# Patient Record
Sex: Female | Born: 1970 | Race: White | Hispanic: No | Marital: Single | Smoking: Former smoker
Health system: Southern US, Community
[De-identification: ages and names within clinical notes are randomized; demographics above are authoritative.]

---

## 2017-10-30 DIAGNOSIS — L821 Other seborrheic keratosis: Secondary | ICD-10-CM | POA: Diagnosis not present

## 2017-10-30 DIAGNOSIS — D225 Melanocytic nevi of trunk: Secondary | ICD-10-CM | POA: Diagnosis not present

## 2017-10-30 DIAGNOSIS — D2261 Melanocytic nevi of right upper limb, including shoulder: Secondary | ICD-10-CM | POA: Diagnosis not present

## 2017-10-30 DIAGNOSIS — D2262 Melanocytic nevi of left upper limb, including shoulder: Secondary | ICD-10-CM | POA: Diagnosis not present

## 2017-10-30 DIAGNOSIS — D485 Neoplasm of uncertain behavior of skin: Secondary | ICD-10-CM | POA: Diagnosis not present

## 2017-11-05 DIAGNOSIS — Z1231 Encounter for screening mammogram for malignant neoplasm of breast: Secondary | ICD-10-CM | POA: Diagnosis not present

## 2017-11-05 DIAGNOSIS — N6002 Solitary cyst of left breast: Secondary | ICD-10-CM | POA: Diagnosis not present

## 2017-11-07 DIAGNOSIS — Z1231 Encounter for screening mammogram for malignant neoplasm of breast: Secondary | ICD-10-CM | POA: Diagnosis not present

## 2017-11-07 DIAGNOSIS — N6002 Solitary cyst of left breast: Secondary | ICD-10-CM | POA: Diagnosis not present

## 2017-11-13 DIAGNOSIS — N63 Unspecified lump in unspecified breast: Secondary | ICD-10-CM | POA: Diagnosis not present

## 2017-11-14 DIAGNOSIS — N6002 Solitary cyst of left breast: Secondary | ICD-10-CM | POA: Diagnosis not present

## 2017-11-14 DIAGNOSIS — N6012 Diffuse cystic mastopathy of left breast: Secondary | ICD-10-CM | POA: Diagnosis not present

## 2017-11-14 DIAGNOSIS — R921 Mammographic calcification found on diagnostic imaging of breast: Secondary | ICD-10-CM | POA: Diagnosis not present

## 2017-11-14 DIAGNOSIS — Z1231 Encounter for screening mammogram for malignant neoplasm of breast: Secondary | ICD-10-CM | POA: Diagnosis not present

## 2019-02-03 DIAGNOSIS — R921 Mammographic calcification found on diagnostic imaging of breast: Secondary | ICD-10-CM | POA: Diagnosis not present

## 2019-04-02 DIAGNOSIS — D2262 Melanocytic nevi of left upper limb, including shoulder: Secondary | ICD-10-CM | POA: Diagnosis not present

## 2019-04-02 DIAGNOSIS — D225 Melanocytic nevi of trunk: Secondary | ICD-10-CM | POA: Diagnosis not present

## 2019-04-02 DIAGNOSIS — D485 Neoplasm of uncertain behavior of skin: Secondary | ICD-10-CM | POA: Diagnosis not present

## 2019-04-02 DIAGNOSIS — D2271 Melanocytic nevi of right lower limb, including hip: Secondary | ICD-10-CM | POA: Diagnosis not present

## 2019-04-02 DIAGNOSIS — D2261 Melanocytic nevi of right upper limb, including shoulder: Secondary | ICD-10-CM | POA: Diagnosis not present

## 2019-07-29 DIAGNOSIS — Z0000 Encounter for general adult medical examination without abnormal findings: Secondary | ICD-10-CM | POA: Diagnosis not present

## 2019-08-12 DIAGNOSIS — Z1159 Encounter for screening for other viral diseases: Secondary | ICD-10-CM | POA: Diagnosis not present

## 2019-08-12 DIAGNOSIS — Z13220 Encounter for screening for lipoid disorders: Secondary | ICD-10-CM | POA: Diagnosis not present

## 2019-08-12 DIAGNOSIS — Z23 Encounter for immunization: Secondary | ICD-10-CM | POA: Diagnosis not present

## 2019-08-12 DIAGNOSIS — R7301 Impaired fasting glucose: Secondary | ICD-10-CM | POA: Diagnosis not present

## 2019-08-12 DIAGNOSIS — Z0000 Encounter for general adult medical examination without abnormal findings: Secondary | ICD-10-CM | POA: Diagnosis not present

## 2019-09-15 DIAGNOSIS — E7800 Pure hypercholesterolemia, unspecified: Secondary | ICD-10-CM | POA: Diagnosis not present

## 2019-09-15 DIAGNOSIS — I10 Essential (primary) hypertension: Secondary | ICD-10-CM | POA: Diagnosis not present

## 2019-09-15 DIAGNOSIS — E119 Type 2 diabetes mellitus without complications: Secondary | ICD-10-CM | POA: Diagnosis not present

## 2019-11-26 DIAGNOSIS — E119 Type 2 diabetes mellitus without complications: Secondary | ICD-10-CM | POA: Diagnosis not present

## 2020-02-02 DIAGNOSIS — Z1231 Encounter for screening mammogram for malignant neoplasm of breast: Secondary | ICD-10-CM | POA: Diagnosis not present

## 2020-03-15 DIAGNOSIS — E119 Type 2 diabetes mellitus without complications: Secondary | ICD-10-CM | POA: Diagnosis not present

## 2020-03-15 DIAGNOSIS — I10 Essential (primary) hypertension: Secondary | ICD-10-CM | POA: Diagnosis not present

## 2020-03-15 DIAGNOSIS — E7800 Pure hypercholesterolemia, unspecified: Secondary | ICD-10-CM | POA: Diagnosis not present

## 2020-03-15 DIAGNOSIS — E669 Obesity, unspecified: Secondary | ICD-10-CM | POA: Diagnosis not present

## 2020-08-09 DIAGNOSIS — I10 Essential (primary) hypertension: Secondary | ICD-10-CM | POA: Diagnosis not present

## 2020-08-09 DIAGNOSIS — E7800 Pure hypercholesterolemia, unspecified: Secondary | ICD-10-CM | POA: Diagnosis not present

## 2020-08-09 DIAGNOSIS — E119 Type 2 diabetes mellitus without complications: Secondary | ICD-10-CM | POA: Diagnosis not present

## 2020-08-09 DIAGNOSIS — Z0000 Encounter for general adult medical examination without abnormal findings: Secondary | ICD-10-CM | POA: Diagnosis not present

## 2020-08-19 DIAGNOSIS — E119 Type 2 diabetes mellitus without complications: Secondary | ICD-10-CM | POA: Diagnosis not present

## 2020-08-19 DIAGNOSIS — Z20822 Contact with and (suspected) exposure to covid-19: Secondary | ICD-10-CM | POA: Diagnosis not present

## 2020-08-19 DIAGNOSIS — I10 Essential (primary) hypertension: Secondary | ICD-10-CM | POA: Diagnosis not present

## 2020-11-15 DIAGNOSIS — E119 Type 2 diabetes mellitus without complications: Secondary | ICD-10-CM | POA: Diagnosis not present

## 2020-11-15 DIAGNOSIS — H35033 Hypertensive retinopathy, bilateral: Secondary | ICD-10-CM | POA: Diagnosis not present

## 2020-11-15 DIAGNOSIS — H2513 Age-related nuclear cataract, bilateral: Secondary | ICD-10-CM | POA: Diagnosis not present

## 2020-11-15 DIAGNOSIS — H16143 Punctate keratitis, bilateral: Secondary | ICD-10-CM | POA: Diagnosis not present

## 2021-02-07 DIAGNOSIS — Z1231 Encounter for screening mammogram for malignant neoplasm of breast: Secondary | ICD-10-CM | POA: Diagnosis not present

## 2021-02-09 DIAGNOSIS — I10 Essential (primary) hypertension: Secondary | ICD-10-CM | POA: Diagnosis not present

## 2021-02-09 DIAGNOSIS — E119 Type 2 diabetes mellitus without complications: Secondary | ICD-10-CM | POA: Diagnosis not present

## 2021-02-09 DIAGNOSIS — E7800 Pure hypercholesterolemia, unspecified: Secondary | ICD-10-CM | POA: Diagnosis not present

## 2021-02-14 DIAGNOSIS — I10 Essential (primary) hypertension: Secondary | ICD-10-CM | POA: Diagnosis not present

## 2021-02-14 DIAGNOSIS — E119 Type 2 diabetes mellitus without complications: Secondary | ICD-10-CM | POA: Diagnosis not present

## 2021-04-26 DIAGNOSIS — D2262 Melanocytic nevi of left upper limb, including shoulder: Secondary | ICD-10-CM | POA: Diagnosis not present

## 2021-04-26 DIAGNOSIS — D225 Melanocytic nevi of trunk: Secondary | ICD-10-CM | POA: Diagnosis not present

## 2021-04-26 DIAGNOSIS — L918 Other hypertrophic disorders of the skin: Secondary | ICD-10-CM | POA: Diagnosis not present

## 2021-04-26 DIAGNOSIS — D2261 Melanocytic nevi of right upper limb, including shoulder: Secondary | ICD-10-CM | POA: Diagnosis not present

## 2021-08-15 DIAGNOSIS — E119 Type 2 diabetes mellitus without complications: Secondary | ICD-10-CM | POA: Diagnosis not present

## 2021-08-15 DIAGNOSIS — I10 Essential (primary) hypertension: Secondary | ICD-10-CM | POA: Diagnosis not present

## 2021-08-15 DIAGNOSIS — E7800 Pure hypercholesterolemia, unspecified: Secondary | ICD-10-CM | POA: Diagnosis not present

## 2021-09-06 DIAGNOSIS — Z23 Encounter for immunization: Secondary | ICD-10-CM | POA: Diagnosis not present

## 2021-09-06 DIAGNOSIS — E119 Type 2 diabetes mellitus without complications: Secondary | ICD-10-CM | POA: Diagnosis not present

## 2021-09-06 DIAGNOSIS — Z0000 Encounter for general adult medical examination without abnormal findings: Secondary | ICD-10-CM | POA: Diagnosis not present

## 2021-10-13 ENCOUNTER — Ambulatory Visit (INDEPENDENT_AMBULATORY_CARE_PROVIDER_SITE_OTHER): Payer: BC Managed Care – PPO | Admitting: Cardiovascular Disease

## 2021-10-13 ENCOUNTER — Other Ambulatory Visit: Payer: Self-pay

## 2021-10-13 ENCOUNTER — Encounter: Payer: Self-pay | Admitting: Cardiovascular Disease

## 2021-10-13 DIAGNOSIS — E785 Hyperlipidemia, unspecified: Secondary | ICD-10-CM | POA: Insufficient documentation

## 2021-10-13 DIAGNOSIS — I10 Essential (primary) hypertension: Secondary | ICD-10-CM | POA: Diagnosis not present

## 2021-10-13 DIAGNOSIS — Z720 Tobacco use: Secondary | ICD-10-CM

## 2021-10-13 DIAGNOSIS — E782 Mixed hyperlipidemia: Secondary | ICD-10-CM

## 2021-10-13 DIAGNOSIS — Z8249 Family history of ischemic heart disease and other diseases of the circulatory system: Secondary | ICD-10-CM | POA: Insufficient documentation

## 2021-10-13 DIAGNOSIS — I739 Peripheral vascular disease, unspecified: Secondary | ICD-10-CM | POA: Insufficient documentation

## 2021-10-13 NOTE — Assessment & Plan Note (Signed)
30 to 40 pack years of tobacco abuse having quit in 2016.  She does not have noticeable clinical sequelae.

## 2021-10-13 NOTE — Assessment & Plan Note (Signed)
History of essential hypertension blood pressure measured today 176/86.  Her pressure at 1 she was in her PCPs office recently was normal.  She is on losartan.

## 2021-10-13 NOTE — Assessment & Plan Note (Signed)
Diana Gates complains of bilateral calf claudication left greater than right for the last year.  This is not lifestyle limiting but given her family history she wishes to have this further evaluated.  We will get lower extremity arterial Doppler studies.

## 2021-10-13 NOTE — Assessment & Plan Note (Signed)
Both parents have had open heart surgery.  Her mother, [name] , is a patient of mine as well, and has a history of CAD and PAD.

## 2021-10-13 NOTE — Patient Instructions (Signed)
Medication Instructions:  Your physician recommends that you continue on your current medications as directed. Please refer to the Current Medication list given to you today.  *If you need a refill on your cardiac medications before your next appointment, please call your pharmacy*   Testing/Procedures: Your physician has requested that you have a lower extremity arterial duplex. This test is an ultrasound of the arteries in the legs. It looks at arterial blood flow in the legs. Allow one hour for Lower Arterial scans. There are no restrictions or special instructions   Your physician has requested that you have an ankle brachial index (ABI). During this test an ultrasound and blood pressure cuff are used to evaluate the arteries that supply the arms and legs with blood. Allow thirty minutes for this exam. There are no restrictions or special instructions. These procedures are done at [address] [facility]   Dr. Allyson Sabal has ordered a CT coronary calcium score.   Test location:  [facility] ([address] [place], [place] [address])   This is $99 out of pocket.   Coronary CalciumScan A coronary calcium scan is an imaging test used to look for deposits of calcium and other fatty materials (plaques) in the inner lining of the blood vessels of the heart (coronary arteries). These deposits of calcium and plaques can partly clog and narrow the coronary arteries without producing any symptoms or warning signs. This puts a person at risk for a heart attack. This test can detect these deposits before symptoms develop. Tell a health care provider about: Any allergies you have. All medicines you are taking, including vitamins, herbs, eye drops, creams, and over-the-counter medicines. Any problems you or family members have had with anesthetic medicines. Any blood disorders you have. Any surgeries you have had. Any medical conditions you have. Whether you are pregnant or may be  pregnant. What are the risks? Generally, this is a safe procedure. However, problems may occur, including: Harm to a pregnant woman and her unborn baby. This test involves the use of radiation. Radiation exposure can be dangerous to a pregnant woman and her unborn baby. If you are pregnant, you generally should not have this procedure done. Slight increase in the risk of cancer. This is because of the radiation involved in the test. What happens before the procedure? No preparation is needed for this procedure. What happens during the procedure? You will undress and remove any jewelry around your neck or chest. You will put on a hospital gown. Sticky electrodes will be placed on your chest. The electrodes will be connected to an electrocardiogram (ECG) machine to record a tracing of the electrical activity of your heart. A CT scanner will take pictures of your heart. During this time, you will be asked to lie still and hold your breath for 2-3 seconds while a picture of your heart is being taken. The procedure may vary among health care providers and hospitals. What happens after the procedure? You can get dressed. You can return to your normal activities. It is up to you to get the results of your test. Ask your health care provider, or the department that is doing the test, when your results will be ready. Summary A coronary calcium scan is an imaging test used to look for deposits of calcium and other fatty materials (plaques) in the inner lining of the blood vessels of the heart (coronary arteries). Generally, this is a safe procedure. Tell your health care provider if you  are pregnant or may be pregnant. No preparation is needed for this procedure. A CT scanner will take pictures of your heart. You can return to your normal activities after the scan is done. This information is not intended to replace advice given to you by your health care provider. Make sure you discuss any questions  you have with your health care provider. Document Released: 02/09/2008 Document Revised: 07/02/2016 Document Reviewed: 07/02/2016 Elsevier Interactive Patient Education  2017 [employer].    Follow-Up: At [facility], you and your health needs are our priority.  As part of our continuing mission to provide you with exceptional heart care, we have created designated Provider Care Teams.  These Care Teams include your primary Cardiologist (physician) and Advanced Practice Providers (APPs -  Physician Assistants and Nurse Practitioners) who all work together to provide you with the care you need, when you need it.  We recommend signing up for the patient portal called "MyChart".  Sign up information is provided on this After Visit Summary.  MyChart is used to connect with patients for Virtual Visits (Telemedicine).  Patients are able to view lab/test results, encounter notes, upcoming appointments, etc.  Non-urgent messages can be sent to your provider as well.   To learn more about what you can do with MyChart, go to [email or web address].    Your next appointment:   12 month(s)  The format for your next appointment:   In Person  Provider:   Nanetta Batty, MD

## 2021-10-13 NOTE — Progress Notes (Signed)
10/13/2021 Diana Gates   11/13/1970  [id number]  Primary Physician Pcp, No Primary Cardiologist: Diana Harp MD Renae Gloss  HPI:  Diana Gates is a 51 y.o. fit appearing divorced Caucasian female with no children referred by Dr. Lindell Gates, her primary care provider, for evaluation of PAD. Her mother, Diana Gates, is also a patient of mine.  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  Her risk factors include 40-pack-year tobacco abuse having quit in 2016, treated hypertension, diabetes, hyperlipidemia as well as family history with both mother and father who had CAD.  She is never had a heart attack or stroke.  She denies chest pain or shortness of breath.  She does mention claudication left greater than right for the last year which is not lifestyle limiting.   Current Meds  Medication Sig   BIOTIN PO Take 5,000 mg by mouth.   cetirizine (ZYRTEC) 10 MG tablet Take 10 mg by mouth daily.   CINNAMON PO Take 1,000 mg by mouth.   losartan (COZAAR) 25 MG tablet Take 25 mg by mouth daily.   metFORMIN (GLUCOPHAGE-XR) 500 MG 24 hr tablet SMARTSIG:2 Tablet(s) By Mouth Every Evening   Probiotic Product (PROBIOTIC PO) Take by mouth.   rosuvastatin (CRESTOR) 10 MG tablet Take 10 mg by mouth daily.     No Known Allergies  Social History   Socioeconomic History   Marital status: Single    Spouse name: Not on file   Number of children: Not on file   Years of education: Not on file   Highest education level: Not on file  Occupational History   Not on file  Tobacco Use   Smoking status: Former    Types: Cigarettes   Smokeless tobacco: Never  Substance and Sexual Activity   Alcohol use: Not on file   Drug use: Not on file   Sexual activity: Not on file  Other Topics Concern   Not on file  Social History Narrative   Not on file   Social Determinants of Health   Financial Resource Strain: Not on file  Food Insecurity: Not on file   Transportation Needs: Not on file  Physical Activity: Not on file  Stress: Not on file  Social Connections: Not on file  Intimate Partner Violence: Not on file     Review of Systems: General: negative for chills, fever, night sweats or weight changes.  Cardiovascular: negative for chest pain, dyspnea on exertion, edema, orthopnea, palpitations, paroxysmal nocturnal dyspnea or shortness of breath Dermatological: negative for rash Respiratory: negative for cough or wheezing Urologic: negative for hematuria Abdominal: negative for nausea, vomiting, diarrhea, bright red blood per rectum, melena, or hematemesis Neurologic: negative for visual changes, syncope, or dizziness All other systems reviewed and are otherwise negative except as noted above.    Blood pressure (!) 176/86, pulse 89, height 5\' 4"  (1.626 m), weight 166 lb 6.4 oz (75.5 kg), SpO2 98 %.  General appearance: alert and no distress Neck: no adenopathy, no carotid bruit, no JVD, supple, symmetrical, trachea midline, and thyroid not enlarged, symmetric, no tenderness/mass/nodules Lungs: clear to auscultation bilaterally Heart: regular rate and rhythm, S1, S2 normal, no murmur, click, rub or gallop Extremities: extremities normal, atraumatic, no cyanosis or edema Pulses: 2+ and symmetric Skin: Skin color, texture, turgor normal. No rashes or lesions Neurologic: Grossly normal  EKG sinus rhythm at 89 without ST or T wave changes.  There were septal Q waves noted.  I personally reviewed this EKG.  ASSESSMENT AND PLAN:   Family history of heart disease Both parents have had open heart surgery.  Her mother, Diana Gates , is a patient of mine as well, and has a history of CAD and PAD.  Tobacco abuse 30 to 40 pack years of tobacco abuse having quit in 2016.  She does not have noticeable clinical sequelae.  Essential hypertension History of essential hypertension blood pressure measured today 176/86.  Her pressure at 1 she  was in her PCPs office recently was normal.  She is on losartan.  Hyperlipidemia History of hyperlipidemia on rosuvastatin with lipid profile performed 08/15/2021 revealing total cholesterol 163, LDL 71 and HDL of 50.  Peripheral arterial disease ([place]) [name] complains of bilateral calf claudication left greater than right for the last year.  This is not lifestyle limiting but given her family history she wishes to have this further evaluated.  We will get lower extremity arterial Doppler studies.     Diana Harp MD FACP,FACC,FAHA, [facility] 10/13/2021 8:24 AM

## 2021-10-13 NOTE — Assessment & Plan Note (Signed)
History of hyperlipidemia on rosuvastatin with lipid profile performed 08/15/2021 revealing total cholesterol 163, LDL 71 and HDL of 50.

## 2021-11-07 ENCOUNTER — Other Ambulatory Visit: Payer: Self-pay

## 2021-11-07 ENCOUNTER — Ambulatory Visit (HOSPITAL_COMMUNITY)
Admission: RE | Admit: 2021-11-07 | Discharge: 2021-11-07 | Disposition: A | Discharge status: Home / Self Care | Payer: BC Managed Care – PPO | Source: Ambulatory Visit | Attending: Cardiology | Admitting: Cardiology

## 2021-11-07 DIAGNOSIS — I10 Essential (primary) hypertension: Secondary | ICD-10-CM | POA: Diagnosis not present

## 2021-11-07 DIAGNOSIS — M79662 Pain in left lower leg: Secondary | ICD-10-CM | POA: Insufficient documentation

## 2021-11-07 DIAGNOSIS — E119 Type 2 diabetes mellitus without complications: Secondary | ICD-10-CM | POA: Insufficient documentation

## 2021-11-07 DIAGNOSIS — Z720 Tobacco use: Secondary | ICD-10-CM | POA: Diagnosis not present

## 2021-11-07 DIAGNOSIS — E782 Mixed hyperlipidemia: Secondary | ICD-10-CM | POA: Diagnosis not present

## 2021-11-07 DIAGNOSIS — Z8249 Family history of ischemic heart disease and other diseases of the circulatory system: Secondary | ICD-10-CM | POA: Diagnosis not present

## 2021-11-07 DIAGNOSIS — M79661 Pain in right lower leg: Secondary | ICD-10-CM | POA: Diagnosis not present

## 2021-11-15 DIAGNOSIS — H35033 Hypertensive retinopathy, bilateral: Secondary | ICD-10-CM | POA: Diagnosis not present

## 2021-11-15 DIAGNOSIS — D3131 Benign neoplasm of right choroid: Secondary | ICD-10-CM | POA: Diagnosis not present

## 2021-11-15 DIAGNOSIS — E119 Type 2 diabetes mellitus without complications: Secondary | ICD-10-CM | POA: Diagnosis not present

## 2021-11-15 DIAGNOSIS — H16143 Punctate keratitis, bilateral: Secondary | ICD-10-CM | POA: Diagnosis not present

## 2021-11-30 ENCOUNTER — Ambulatory Visit
Admission: RE | Admit: 2021-11-30 | Discharge: 2021-11-30 | Disposition: A | Discharge status: Home / Self Care | Payer: Self-pay | Source: Ambulatory Visit | Attending: Cardiovascular Disease | Admitting: Cardiovascular Disease

## 2021-11-30 DIAGNOSIS — Z8249 Family history of ischemic heart disease and other diseases of the circulatory system: Secondary | ICD-10-CM

## 2021-11-30 DIAGNOSIS — I10 Essential (primary) hypertension: Secondary | ICD-10-CM

## 2021-11-30 DIAGNOSIS — E782 Mixed hyperlipidemia: Secondary | ICD-10-CM

## 2021-11-30 DIAGNOSIS — Z720 Tobacco use: Secondary | ICD-10-CM

## 2022-02-13 DIAGNOSIS — Z1231 Encounter for screening mammogram for malignant neoplasm of breast: Secondary | ICD-10-CM | POA: Diagnosis not present

## 2022-03-05 DIAGNOSIS — E7800 Pure hypercholesterolemia, unspecified: Secondary | ICD-10-CM | POA: Diagnosis not present

## 2022-03-05 DIAGNOSIS — E119 Type 2 diabetes mellitus without complications: Secondary | ICD-10-CM | POA: Diagnosis not present

## 2022-03-05 DIAGNOSIS — I10 Essential (primary) hypertension: Secondary | ICD-10-CM | POA: Diagnosis not present

## 2022-04-02 DIAGNOSIS — Z1211 Encounter for screening for malignant neoplasm of colon: Secondary | ICD-10-CM | POA: Diagnosis not present

## 2022-05-08 DIAGNOSIS — L718 Other rosacea: Secondary | ICD-10-CM | POA: Diagnosis not present

## 2022-05-08 DIAGNOSIS — D2262 Melanocytic nevi of left upper limb, including shoulder: Secondary | ICD-10-CM | POA: Diagnosis not present

## 2022-05-08 DIAGNOSIS — D225 Melanocytic nevi of trunk: Secondary | ICD-10-CM | POA: Diagnosis not present

## 2022-05-08 DIAGNOSIS — D2272 Melanocytic nevi of left lower limb, including hip: Secondary | ICD-10-CM | POA: Diagnosis not present

## 2022-07-08 IMAGING — CT CT CARDIAC CORONARY ARTERY CALCIUM SCORE
3 series · 14 of 20 positions shown, 16 images · non-contrast
Comparison: None.
COMPARISON: None.

Addendum:
EXAM:
OVER-READ INTERPRETATION  CT CHEST

The following report is an over-read performed by radiologist Dr.
over-read does not include interpretation of cardiac or coronary
anatomy or pathology. The coronary calcium score interpretation by
the cardiologist is attached.
CLINICAL DATA: Cardiovascular Disease Risk stratification
Coronary Calcium Score
TECHNIQUE: A gated, non-contrast computed tomography scan of the heart was
performed using 3mm slice thickness. Axial images were analyzed on a
dedicated workstation. Calcium scoring of the coronary arteries was
performed using the Agatston method.

[Series 2: cascseq 2.0 sa36 (id) (id) · axial · 0.39mm/px · z∈[-248,-168]mm · 4 of 68 slices shown]
[im 14/68  vessel]
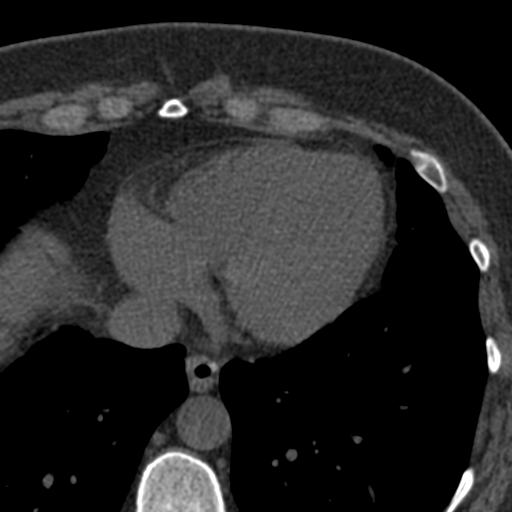
[im 27/68  vessel]
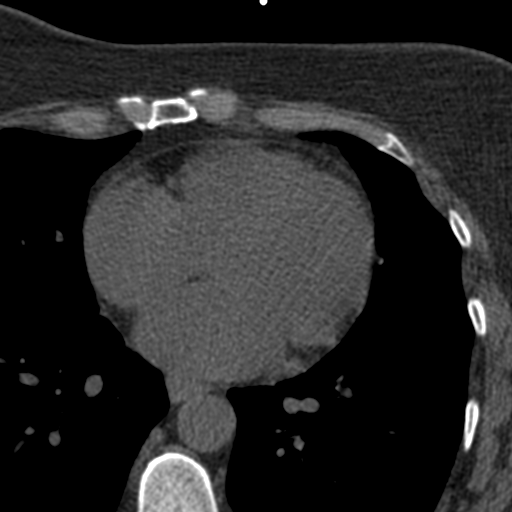
[im 41/68  vessel]
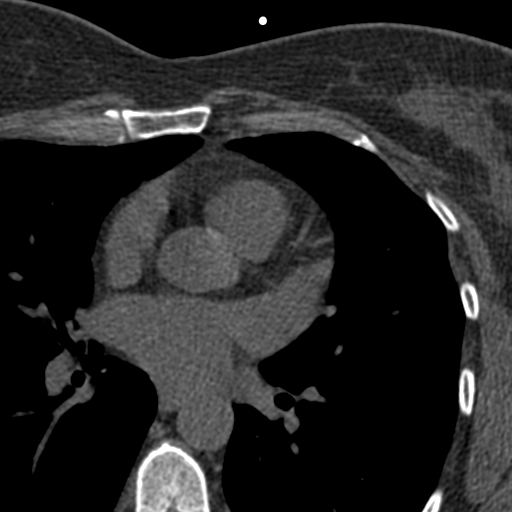
[im 54/68  vessel]
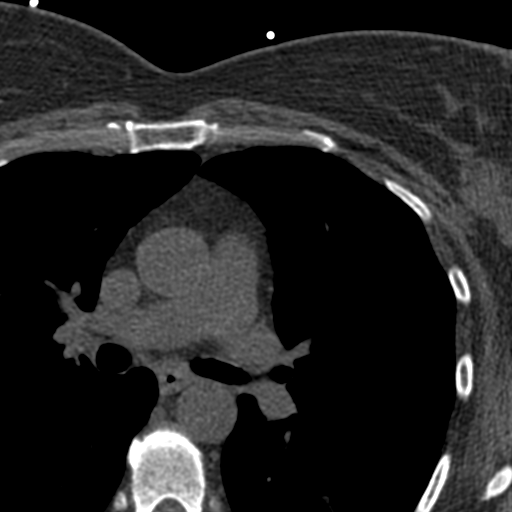

[Series 3: cascseq 2.0 bf37 st · axial · 0.66mm/px · z∈[-252,-164]mm · 5 of 68 slices shown, 7 images]
[im 12/68  vessel]
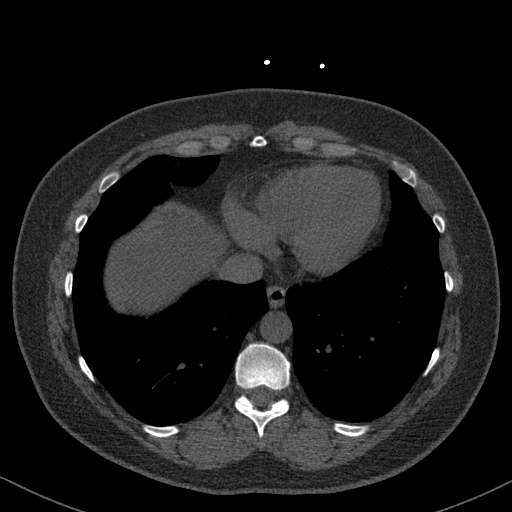
[im 12/68  lung]
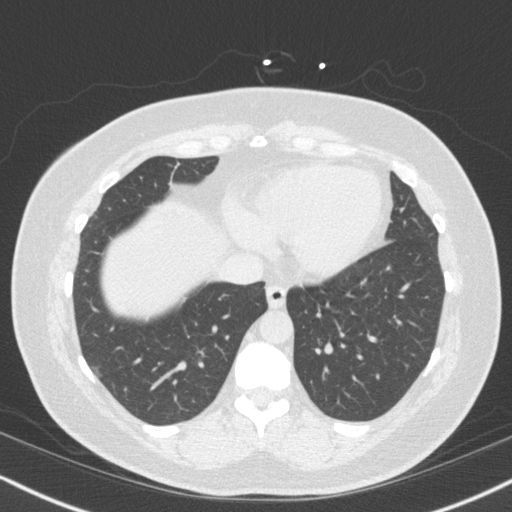
[im 23/68  vessel]
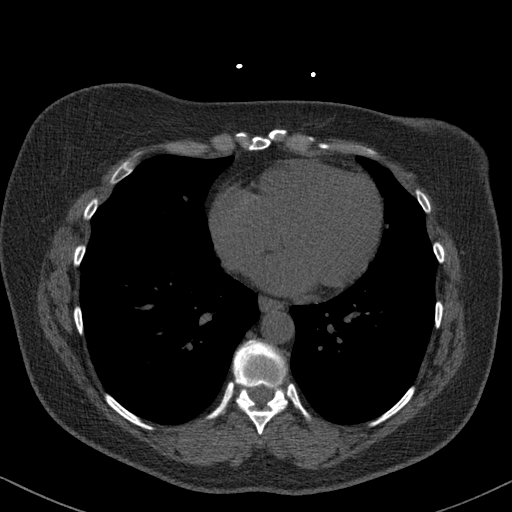
[im 34/68  vessel]
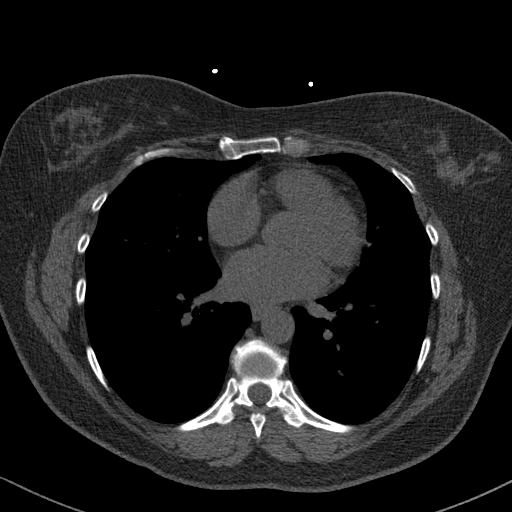
[im 45/68  vessel]
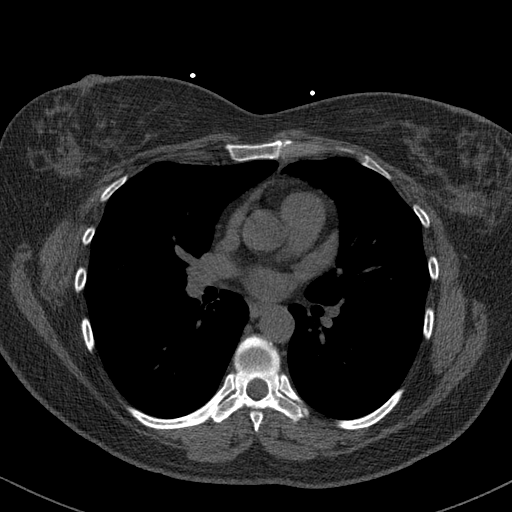
[im 56/68  vessel]
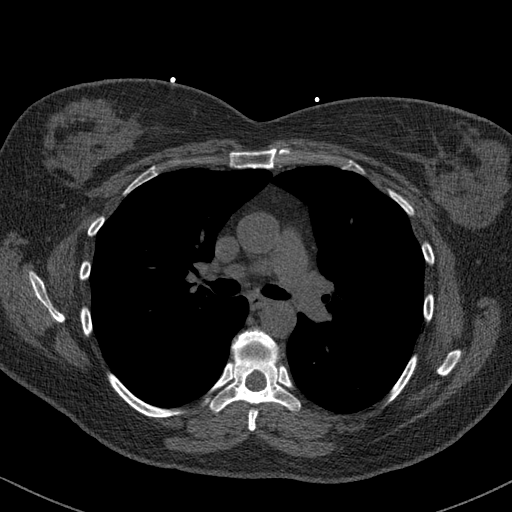
[im 56/68  lung]
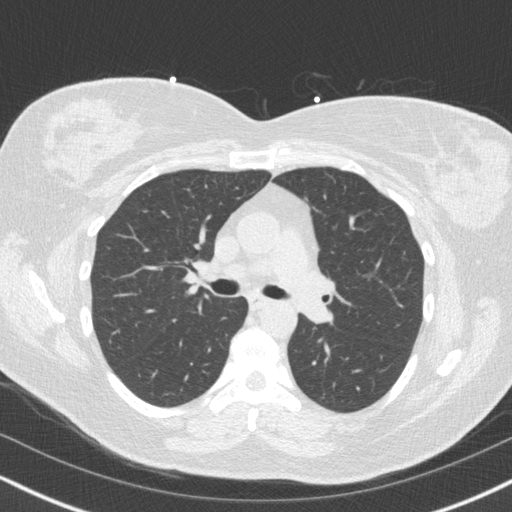

[Series 4: cascseq 2.0 br59 lung · axial · 0.66mm/px · z∈[-252,-164]mm · 5 of 68 slices shown]
[im 12/68  lung]
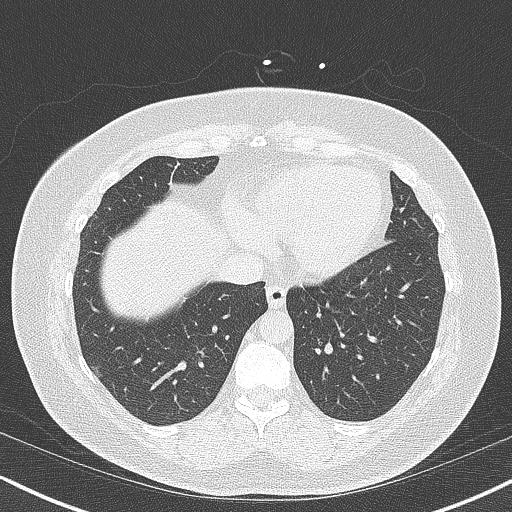
[im 23/68  lung]
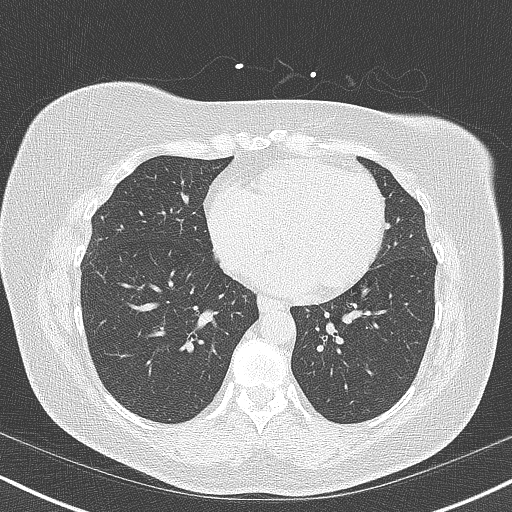
[im 34/68  lung]
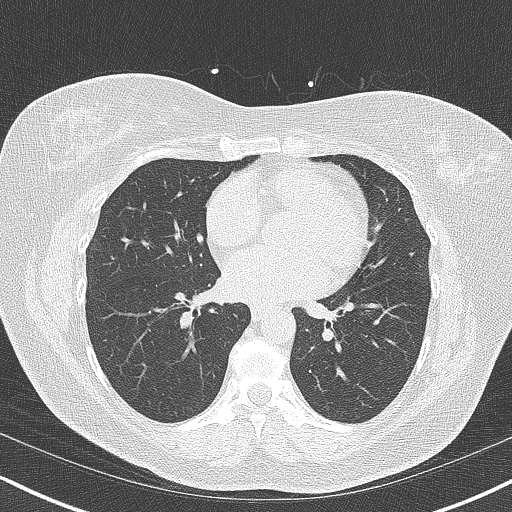
[im 45/68  lung]
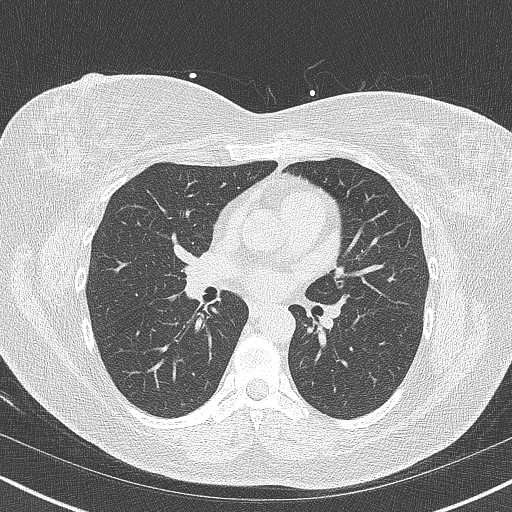
[im 56/68  lung]
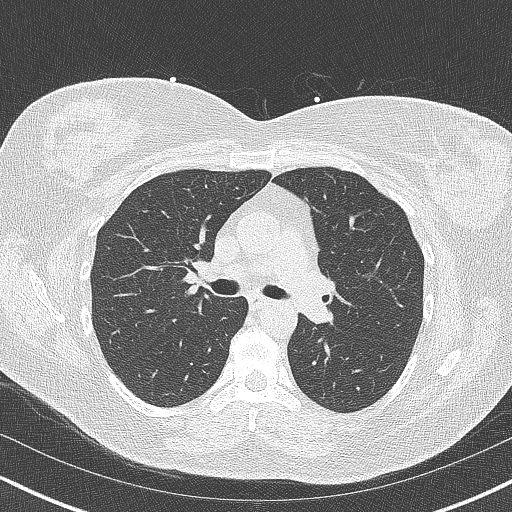

[14 of 20 positions shown; findings below may reference images not displayed]

FINDINGS: Vascular: No significant noncardiac vascular findings.

Mediastinum/Nodes: Visualized mediastinum and hilar regions
demonstrate no lymphadenopathy or masses.

Lungs/Pleura: Visualized lungs show no evidence of pulmonary edema,
consolidation, pneumothorax, nodule or pleural fluid.

Upper Abdomen: The liver demonstrates evidence of steatosis.

Musculoskeletal: No chest wall mass or suspicious bone lesions
identified.
IMPRESSION: Hepatic steatosis.
FINDINGS: Coronary arteries: Normal origins.

Coronary Calcium Score:

Left main:

Left anterior descending artery:

Left circumflex artery:

Right coronary artery:

Total: 0

Percentile:

Pericardium: Normal.

Ascending Aorta: Normal caliber.

Non-cardiac: See separate report from [REDACTED].
IMPRESSION: Coronary calcium score of 0.



If CAC=0, it is reasonable to withhold statin therapy and reassess
in 5 to 10 years, as long as higher risk conditions are absent
(diabetes mellitus, family history of premature CHD in first degree
relatives (males <55 years; females <65 years), cigarette smoking,
or LDL >=190 mg/dL).

If CAC is 1 to 99, it is reasonable to initiate statin therapy for
patients >=55 years of age.

If CAC is >=100 or >=75th percentile, it is reasonable to initiate
statin therapy at any age.

Cardiology referral should be considered for patients with CAC
scores >=400 or >=75th percentile.

*1539 AHA/ACC/AACVPR/AAPA/ABC/XILDHIBAN/AKARI/EMME/Ante/HOME/GOGUA/LADARRIUS
Guideline on the Management of Blood Cholesterol: A Report of the
American College of Cardiology/American Heart Association Task Force
on Clinical Practice Guidelines. J Am Coll Cardiol.
7834;73(24):0791-0619.

*** End of Addendum ***
EXAM:
OVER-READ INTERPRETATION  CT CHEST

The following report is an over-read performed by radiologist Dr.
over-read does not include interpretation of cardiac or coronary
anatomy or pathology. The coronary calcium score interpretation by
the cardiologist is attached.
FINDINGS: Vascular: No significant noncardiac vascular findings.

Mediastinum/Nodes: Visualized mediastinum and hilar regions
demonstrate no lymphadenopathy or masses.

Lungs/Pleura: Visualized lungs show no evidence of pulmonary edema,
consolidation, pneumothorax, nodule or pleural fluid.

Upper Abdomen: The liver demonstrates evidence of steatosis.

Musculoskeletal: No chest wall mass or suspicious bone lesions
identified.
IMPRESSION: Hepatic steatosis.

## 2022-09-19 DIAGNOSIS — I10 Essential (primary) hypertension: Secondary | ICD-10-CM | POA: Diagnosis not present

## 2022-09-19 DIAGNOSIS — Z0000 Encounter for general adult medical examination without abnormal findings: Secondary | ICD-10-CM | POA: Diagnosis not present

## 2022-09-19 DIAGNOSIS — Z23 Encounter for immunization: Secondary | ICD-10-CM | POA: Diagnosis not present

## 2022-09-19 DIAGNOSIS — E7800 Pure hypercholesterolemia, unspecified: Secondary | ICD-10-CM | POA: Diagnosis not present

## 2022-09-19 DIAGNOSIS — E119 Type 2 diabetes mellitus without complications: Secondary | ICD-10-CM | POA: Diagnosis not present

## 2022-10-03 DIAGNOSIS — U071 COVID-19: Secondary | ICD-10-CM | POA: Diagnosis not present

## 2022-11-21 DIAGNOSIS — H16143 Punctate keratitis, bilateral: Secondary | ICD-10-CM | POA: Diagnosis not present

## 2022-11-21 DIAGNOSIS — H35033 Hypertensive retinopathy, bilateral: Secondary | ICD-10-CM | POA: Diagnosis not present

## 2022-11-21 DIAGNOSIS — D3131 Benign neoplasm of right choroid: Secondary | ICD-10-CM | POA: Diagnosis not present

## 2022-11-21 DIAGNOSIS — E119 Type 2 diabetes mellitus without complications: Secondary | ICD-10-CM | POA: Diagnosis not present

## 2023-02-19 DIAGNOSIS — Z1231 Encounter for screening mammogram for malignant neoplasm of breast: Secondary | ICD-10-CM | POA: Diagnosis not present

## 2023-03-20 DIAGNOSIS — I10 Essential (primary) hypertension: Secondary | ICD-10-CM | POA: Diagnosis not present

## 2023-03-20 DIAGNOSIS — E119 Type 2 diabetes mellitus without complications: Secondary | ICD-10-CM | POA: Diagnosis not present

## 2023-03-20 DIAGNOSIS — E7800 Pure hypercholesterolemia, unspecified: Secondary | ICD-10-CM | POA: Diagnosis not present

## 2023-04-04 DIAGNOSIS — R945 Abnormal results of liver function studies: Secondary | ICD-10-CM | POA: Diagnosis not present

## 2023-05-14 DIAGNOSIS — L821 Other seborrheic keratosis: Secondary | ICD-10-CM | POA: Diagnosis not present

## 2023-05-14 DIAGNOSIS — D2261 Melanocytic nevi of right upper limb, including shoulder: Secondary | ICD-10-CM | POA: Diagnosis not present

## 2023-05-14 DIAGNOSIS — D2262 Melanocytic nevi of left upper limb, including shoulder: Secondary | ICD-10-CM | POA: Diagnosis not present

## 2023-05-14 DIAGNOSIS — D225 Melanocytic nevi of trunk: Secondary | ICD-10-CM | POA: Diagnosis not present

## 2023-11-12 DIAGNOSIS — E7800 Pure hypercholesterolemia, unspecified: Secondary | ICD-10-CM | POA: Diagnosis not present

## 2023-11-12 DIAGNOSIS — E119 Type 2 diabetes mellitus without complications: Secondary | ICD-10-CM | POA: Diagnosis not present

## 2023-11-12 DIAGNOSIS — I10 Essential (primary) hypertension: Secondary | ICD-10-CM | POA: Diagnosis not present

## 2023-11-12 DIAGNOSIS — Z0000 Encounter for general adult medical examination without abnormal findings: Secondary | ICD-10-CM | POA: Diagnosis not present

## 2023-11-12 DIAGNOSIS — R0683 Snoring: Secondary | ICD-10-CM | POA: Diagnosis not present

## 2023-11-21 DIAGNOSIS — G4719 Other hypersomnia: Secondary | ICD-10-CM | POA: Diagnosis not present

## 2023-12-04 DIAGNOSIS — H16143 Punctate keratitis, bilateral: Secondary | ICD-10-CM | POA: Diagnosis not present

## 2023-12-04 DIAGNOSIS — E119 Type 2 diabetes mellitus without complications: Secondary | ICD-10-CM | POA: Diagnosis not present

## 2023-12-04 DIAGNOSIS — H35033 Hypertensive retinopathy, bilateral: Secondary | ICD-10-CM | POA: Diagnosis not present

## 2023-12-04 DIAGNOSIS — D3131 Benign neoplasm of right choroid: Secondary | ICD-10-CM | POA: Diagnosis not present

## 2023-12-05 DIAGNOSIS — G4710 Hypersomnia, unspecified: Secondary | ICD-10-CM | POA: Diagnosis not present

## 2023-12-11 DIAGNOSIS — G4733 Obstructive sleep apnea (adult) (pediatric): Secondary | ICD-10-CM | POA: Diagnosis not present

## 2024-02-20 DIAGNOSIS — G4733 Obstructive sleep apnea (adult) (pediatric): Secondary | ICD-10-CM | POA: Diagnosis not present

## 2024-02-20 DIAGNOSIS — Z1231 Encounter for screening mammogram for malignant neoplasm of breast: Secondary | ICD-10-CM | POA: Diagnosis not present

## 2024-03-05 DIAGNOSIS — G4733 Obstructive sleep apnea (adult) (pediatric): Secondary | ICD-10-CM | POA: Diagnosis not present

## 2024-04-09 DIAGNOSIS — G4733 Obstructive sleep apnea (adult) (pediatric): Secondary | ICD-10-CM | POA: Diagnosis not present

## 2024-04-10 DIAGNOSIS — G4733 Obstructive sleep apnea (adult) (pediatric): Secondary | ICD-10-CM | POA: Diagnosis not present

## 2024-05-14 DIAGNOSIS — I10 Essential (primary) hypertension: Secondary | ICD-10-CM | POA: Diagnosis not present

## 2024-05-14 DIAGNOSIS — E119 Type 2 diabetes mellitus without complications: Secondary | ICD-10-CM | POA: Diagnosis not present

## 2024-05-14 DIAGNOSIS — Z23 Encounter for immunization: Secondary | ICD-10-CM | POA: Diagnosis not present

## 2024-05-14 DIAGNOSIS — E7800 Pure hypercholesterolemia, unspecified: Secondary | ICD-10-CM | POA: Diagnosis not present
